# Patient Record
Sex: Male | Born: 1960 | Race: Black or African American | Hispanic: No | Marital: Married | Smoking: Never smoker
Health system: Southern US, Community
[De-identification: ages and names within clinical notes are randomized; demographics above are authoritative.]

## PROBLEM LIST (undated history)

## (undated) DIAGNOSIS — K219 Gastro-esophageal reflux disease without esophagitis: Secondary | ICD-10-CM

## (undated) DIAGNOSIS — D649 Anemia, unspecified: Secondary | ICD-10-CM

## (undated) DIAGNOSIS — R161 Splenomegaly, not elsewhere classified: Secondary | ICD-10-CM

## (undated) HISTORY — DX: Anemia, unspecified: D64.9

## (undated) HISTORY — DX: Splenomegaly, not elsewhere classified: R16.1

## (undated) HISTORY — DX: Gastro-esophageal reflux disease without esophagitis: K21.9

---

## 2013-02-16 ENCOUNTER — Ambulatory Visit (INDEPENDENT_AMBULATORY_CARE_PROVIDER_SITE_OTHER): Payer: No Typology Code available for payment source | Admitting: Family Medicine

## 2013-02-16 VITALS — BP 112/64 | HR 77 | Temp 98.5°F | Resp 18 | Ht 68.0 in | Wt 165.0 lb

## 2013-02-16 DIAGNOSIS — B1910 Unspecified viral hepatitis B without hepatic coma: Secondary | ICD-10-CM

## 2013-02-16 DIAGNOSIS — K2970 Gastritis, unspecified, without bleeding: Secondary | ICD-10-CM | POA: Insufficient documentation

## 2013-02-16 DIAGNOSIS — D696 Thrombocytopenia, unspecified: Secondary | ICD-10-CM

## 2013-02-16 DIAGNOSIS — R161 Splenomegaly, not elsewhere classified: Secondary | ICD-10-CM | POA: Insufficient documentation

## 2013-02-16 LAB — COMPREHENSIVE METABOLIC PANEL
Albumin: 3.5 g/dL (ref 3.5–5.2)
BUN: 19 mg/dL (ref 6–23)
CO2: 27 mEq/L (ref 19–32)
Calcium: 10.2 mg/dL (ref 8.4–10.5)
Chloride: 105 mEq/L (ref 96–112)
Creat: 1.63 mg/dL — ABNORMAL HIGH (ref 0.50–1.35)
Total Protein: 5.3 g/dL — ABNORMAL LOW (ref 6.0–8.3)

## 2013-02-16 LAB — POCT CBC
Granulocyte percent: 49.6 %G (ref 37–80)
HCT, POC: 31.7 % — AB (ref 43.5–53.7)
Lymph, poc: 1.3 (ref 0.6–3.4)
MCH, POC: 25.5 pg — AB (ref 27–31.2)
MCV: 84 fL (ref 80–97)
MID (cbc): 0.7 (ref 0–0.9)
Platelet Count, POC: 82 10*3/uL — AB (ref 142–424)
RBC: 3.77 M/uL — AB (ref 4.69–6.13)
WBC: 3.9 10*3/uL — AB (ref 4.6–10.2)

## 2013-02-16 MED ORDER — OMEPRAZOLE 40 MG PO CPDR: 40.0000 mg | DELAYED_RELEASE_CAPSULE | Freq: Every day | ORAL | Status: DC

## 2013-02-16 NOTE — Progress Notes (Addendum)
Subjective:    Patient ID: Anthony Arellano, male    DOB: 24-Aug-1960, 52 y.o.   MRN: [id number] Chief Complaint  Patient presents with  . wants to establish PCP    HPI HPI Comments: Anthony Arellano is a 52 y.o. male who presents to the [facility]  complaining of Enlarged spleen which began 7 months ago. Pt is trying to figure out what's causing the enlargement.  He has been seeing dr.'s in the [place] area - with Smitty Cords - but recent move to [place] and insurance changes have him relocating his care - told to come here and see Dr. Dareen Piano for new PCP by his insurance. Pt reports he has had abd CT scan and recent US.  A Also being evaluated for swollen left axillary lymph nodes. No biopsy does not have a hepatologist. Pt denies sickle cell anemia.  Pt has Hepatitis B for 24 years. 3 months ago Ultrasound liver was good at [facility] Last blood work was 3 months ago.  Pt is having epigastric abdominal pain. Denies pain after eating, but is using omeprazole which usually completely treats his pain but ran out of it yesterday. Currently weighs 160. Lost 10 lbs, over the past year and 20 lbs over the past 2 yrs.    No current outpatient prescriptions on file prior to visit.   No current facility-administered medications on file prior to visit.   Past Medical History  Diagnosis Date  . Splenomegaly   . GERD (gastroesophageal reflux disease)   . Anemia    No Known Allergies  Review of Systems  Constitutional: Negative for fever, chills, diaphoresis, activity change and appetite change.  Respiratory: Negative for shortness of breath.   Cardiovascular: Negative for chest pain.  Gastrointestinal: Positive for abdominal pain and abdominal distention. Negative for nausea, vomiting, diarrhea, constipation, blood in stool, anal bleeding and rectal pain.  Genitourinary: Negative for dysuria, frequency and decreased urine volume.  Hematological: Negative for adenopathy.      BP 112/64  Pulse 77   Temp(Src) 98.5 F (36.9 C) (Oral)  Resp 18  Ht 5\' 8"  (1.727 m)  Wt 165 lb (74.844 kg)  BMI 25.09 kg/m2 Objective:   Physical Exam  Nursing note and vitals reviewed. Constitutional: He is oriented to person, place, and time. He appears well-developed and well-nourished. No distress.  HENT:  Head: Normocephalic and atraumatic.  Eyes: EOM are normal.  Neck: Normal range of motion. Neck supple. No thyromegaly present.  No swollen lymph nodes.   Cardiovascular: Normal rate, regular rhythm, normal heart sounds and intact distal pulses.   Pulmonary/Chest: Effort normal and breath sounds normal. No respiratory distress.  Abdominal: Soft. Normal appearance and bowel sounds are normal. He exhibits no distension and no mass. There is splenomegaly. There is no tenderness. There is no rebound, no guarding, no CVA tenderness and negative Murphy's sign. No hernia.  Genitourinary: Rectum normal and prostate normal. Rectal exam shows no tenderness and anal tone normal. Guaiac negative stool.  Musculoskeletal: Normal range of motion.  Lymphadenopathy:    He has no cervical adenopathy.    He has axillary adenopathy.       Right axillary: Pectoral adenopathy present.       Left axillary: Pectoral adenopathy present.  L>Rt  Neurological: He is alert and oriented to person, place, and time.  Skin: Skin is warm and dry. He is not diaphoretic.  Psychiatric: He has a normal mood and affect. Judgment normal.      Results for orders placed  in visit on 02/16/13  POCT CBC      Result Value Range   WBC 3.9 (*) 4.6 - 10.2 K/uL   Lymph, poc 1.3  0.6 - 3.4   POC LYMPH PERCENT 33.6  10 - 50 %L   MID (cbc) 0.7  0 - 0.9   POC MID % 16.8 (*) 0 - 12 %M   POC Granulocyte 1.9 (*) 2 - 6.9   Granulocyte percent 49.6  37 - 80 %G   RBC 3.77 (*) 4.69 - 6.13 M/uL   Hemoglobin 9.6 (*) 14.1 - 18.1 g/dL   HCT, POC 16.1 (*) 09.6 - 53.7 %   MCV 84.0  80 - 97 fL   MCH, POC 25.5 (*) 27 - 31.2 pg   MCHC 30.3 (*) 31.8 - 35.4  g/dL   RDW, POC 04.5     Platelet Count, POC 82 (*) 142 - 424 K/uL   MPV 10.0  0 - 99.8 fL   Assessment & Plan:  Splenomegaly - Plan: POCT CBC, Comprehensive metabolic panel  Unspecified gastritis and gastroduodenitis without mention of hemorrhage  Hepatitis B - Plan: Comprehensive metabolic panel  Sign forms to receive previous testing and lab work. Blood work today. Advised pt to call clinic to see when Dr. Dareen Piano will be available or to schedule an appointment with me. Will refill omeprazole.  Referred to GI.  Meds ordered this encounter  Medications  . DISCONTD: omeprazole (PRILOSEC) 40 MG capsule    Sig: Take 40 mg by mouth daily.  Marland Kitchen omeprazole (PRILOSEC) 40 MG capsule    Sig: Take 1 capsule (40 mg total) by mouth daily.    Dispense:  90 capsule    Refill:  1

## 2013-02-18 ENCOUNTER — Encounter: Payer: Self-pay | Admitting: Family Medicine

## 2013-02-22 LAB — CBC AND DIFFERENTIAL
Platelets: 99 10*3/uL — AB (ref 150–399)
WBC: 4.3 10^3/mL

## 2013-02-22 LAB — HEPATIC FUNCTION PANEL: ALT: 96 U/L — AB (ref 10–40)

## 2013-02-22 LAB — BASIC METABOLIC PANEL: Creatinine: 1.8 mg/dL — AB (ref 0.6–1.3)

## 2013-02-27 ENCOUNTER — Telehealth: Payer: Self-pay

## 2013-02-27 NOTE — Telephone Encounter (Signed)
Language barrier. He is calling for referral, they need records from [place]. Patient indicates he does not have the records. He indicates he did call the previous provider in [place] and the records will be faxed to [facility]. Patient is asking if they were received here, have we gotten any records for patient here, he thinks they may have been sent here.

## 2013-02-27 NOTE — Telephone Encounter (Signed)
Dr Clelia Croft  Patient would like for you to call him regarding his I think he said biopsy? And also about his gi appointment

## 2013-02-27 NOTE — Telephone Encounter (Signed)
We had pt sign releases for his records while he was here but we have not received them yet. I will continue to keep an eye out for him.  If he wants to come back to clinic to re-sign releases and resend he can.

## 2013-02-28 NOTE — Telephone Encounter (Signed)
Re-faxed release today with confirmation.

## 2013-03-01 ENCOUNTER — Telehealth: Payer: Self-pay

## 2013-03-01 NOTE — Telephone Encounter (Signed)
[facility] Alver Fisher box currently. Patient says he will set up an appointment with Dr. Clelia Croft.

## 2013-03-01 NOTE — Telephone Encounter (Signed)
Still no medical records. Patient advised , VM

## 2013-03-01 NOTE — Telephone Encounter (Signed)
Patient is asking medical records has received any records from his previous dr please call patient with questions at [phone]

## 2013-03-02 ENCOUNTER — Ambulatory Visit (INDEPENDENT_AMBULATORY_CARE_PROVIDER_SITE_OTHER): Payer: No Typology Code available for payment source | Admitting: Family Medicine

## 2013-03-02 VITALS — BP 118/62 | HR 78 | Temp 98.2°F | Resp 18 | Ht 69.0 in | Wt 163.0 lb

## 2013-03-02 DIAGNOSIS — R161 Splenomegaly, not elsewhere classified: Secondary | ICD-10-CM

## 2013-03-02 DIAGNOSIS — B1910 Unspecified viral hepatitis B without hepatic coma: Secondary | ICD-10-CM

## 2013-03-02 DIAGNOSIS — R591 Generalized enlarged lymph nodes: Secondary | ICD-10-CM

## 2013-03-02 DIAGNOSIS — R599 Enlarged lymph nodes, unspecified: Secondary | ICD-10-CM

## 2013-03-02 DIAGNOSIS — D696 Thrombocytopenia, unspecified: Secondary | ICD-10-CM

## 2013-03-02 DIAGNOSIS — D649 Anemia, unspecified: Secondary | ICD-10-CM
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
## 2013-03-02 NOTE — Patient Instructions (Signed)
Results for orders placed in visit on 02/16/13  COMPREHENSIVE METABOLIC PANEL      Result Value Range   Sodium 139  135 - 145 mEq/L   Potassium 4.2  3.5 - 5.3 mEq/L   Chloride 105  96 - 112 mEq/L   CO2 27  19 - 32 mEq/L   Glucose, Bld 89  70 - 99 mg/dL   BUN 19  6 - 23 mg/dL   Creat 1.61 (*) 0.96 - 1.35 mg/dL   Total Bilirubin 0.8  0.3 - 1.2 mg/dL   Alkaline Phosphatase 173 (*) 39 - 117 U/L   AST 102 (*) 0 - 37 U/L   ALT 102 (*) 0 - 53 U/L   Total Protein 5.3 (*) 6.0 - 8.3 g/dL   Albumin 3.5  3.5 - 5.2 g/dL   Calcium 04.5  8.4 - 40.9 mg/dL  POCT CBC      Result Value Range   WBC 3.9 (*) 4.6 - 10.2 K/uL   Lymph, poc 1.3  0.6 - 3.4   POC LYMPH PERCENT 33.6  10 - 50 %L   MID (cbc) 0.7  0 - 0.9   POC MID % 16.8 (*) 0 - 12 %M   POC Granulocyte 1.9 (*) 2 - 6.9   Granulocyte percent 49.6  37 - 80 %G   RBC 3.77 (*) 4.69 - 6.13 M/uL   Hemoglobin 9.6 (*) 14.1 - 18.1 g/dL   HCT, POC 81.1 (*) 91.4 - 53.7 %   MCV 84.0  80 - 97 fL   MCH, POC 25.5 (*) 27 - 31.2 pg   MCHC 30.3 (*) 31.8 - 35.4 g/dL   RDW, POC 78.2     Platelet Count, POC 82 (*) 142 - 424 K/uL   MPV 10.0  0 - 99.8 fL

## 2013-03-04 ENCOUNTER — Telehealth: Payer: Self-pay

## 2013-03-04 NOTE — Telephone Encounter (Signed)
Patient calling to check status of referral information to an oncologist he was seen on the 18th by dr. Elbert Ewings. I have explained to the patient the referral process several different ways and he believes that it was supposed to be Monday that he is given an appointment. Please clarify to patient that we must wait for an appt date and time to schedule an appt with a specialist. He says he will keep calling our office unitl we give him the appt date and time.   Patient is very frustrated and there is a language barrier.   [phone]

## 2013-03-05 NOTE — Telephone Encounter (Signed)
Patient is aware of information. He has been waiting on the medical records from his office in [place] where he was seen previously, have these been received by [place]?Marland Kitchen There is a huge language barrier.

## 2013-03-05 NOTE — Telephone Encounter (Signed)
If Pt calls again:  Referring pt to [facility] Hematology/Oncology w/[facility].  Ph:  [phone] Fax: [phone] Received vmail for new pt coordinator. Message asks for 24-48 business hours to return call and provided fax number to fax referral. Left message and faxed all information.  Waiting on call back w/appt date time. Bf  If pt would like to contact the office we are referring him to, he is welcome to at the number provided above. All notes/referral faxed on 03/05/13.

## 2013-03-06 NOTE — Telephone Encounter (Signed)
The records are in the bottom of Dr Alver Fisher box. This has already been documented several days ago.

## 2013-03-06 NOTE — Telephone Encounter (Signed)
These need to be scanned in and sent to the office we referred him to. When you have reviewed, please forward to me.

## 2013-03-07 NOTE — Telephone Encounter (Signed)
Medical records reviewed and given to Amy for fax to specialist - pt needs to see gastroenterology, hematology, and vascular. Then please abstract highlighted portions and scan all. In summary, pt has had chronic hepatitis B since 1991. However, abd [place] on 09/09/12 shows liver is homogeneous w/o enlargement or focal deficit. However, marked splenomegaly has just been diagnosed in past year with the 09/09/12 [place] noted spleen of 26.5 cm craniocaudally.  Possibility of myelodysplastic syndrome or other primary bone marrow abnormality. Consider myelofibrosis. Cannot exclude sarcoidosis. Pt saw hematologist at [facility]  - Dr. Particia Jasper who noted that due to his thrombocytopenia and eosinophilia the differential diagnosis incluces a myeloproliferative disorder or chronic inflammatory process or infection (since he comes from the [place]). For eval he needs flow cytometry bone marrow aspiration biopsy and evaluation for inflammatory disorders with rheumatology as well. However, pt declined all of this due to lack of insurance so nothing has been done.  Pt's primary care notes state that he saw gastroenterology who does not think that his chronic hepatitis B has caused any of this. He has never had a routine colonoscopy.  Pt was apparently seen by [facility] but we do not have any of their notes. Also of note, pt's primary doctor did note a left carotic bruit on exam so we was referred to and seen by [facility] by Dr. Tyson Alias who recommended pt have carotid duplex exam and upper extremity US eval but again these were never done due to lack of insurance.

## 2013-03-07 NOTE — Telephone Encounter (Signed)
Thank you so much have given the records to Lupita Leash so this will be done in referrals.

## 2013-03-08 ENCOUNTER — Telehealth: Payer: Self-pay

## 2013-03-08 ENCOUNTER — Encounter: Payer: Self-pay | Admitting: Internal Medicine

## 2013-03-08 NOTE — Telephone Encounter (Signed)
Patient calling checking status of referral. States it has been 3 weeks and he still does not have an appt. Please send all related records so he can get an appt soon. Hard to understand patient.

## 2013-03-08 NOTE — Progress Notes (Signed)
Dr Juanda Chance has received extensive records from patient's previous GI physician in [place], [place]. Patient was being referred to [place] for hepatitis B. Per Darcey Nora, RN, we do not see patients for Hepatitis. Patient should go to [facility] liver clinic or to [facility] Liver clinic for evaluation. Advised PCC, Amy Hazelwood of this.

## 2013-03-21 ENCOUNTER — Telehealth: Payer: Self-pay

## 2013-03-21 ENCOUNTER — Other Ambulatory Visit: Payer: Self-pay

## 2013-03-21 DIAGNOSIS — R109 Unspecified abdominal pain: Secondary | ICD-10-CM

## 2013-03-21 NOTE — Telephone Encounter (Signed)
Spoke to [place] Arell[place] she does not have order for the [place] office, I have put in for her, so she can get records to them and get him taken care of.

## 2013-03-21 NOTE — Telephone Encounter (Signed)
Have you sent records to appropriate place, patient wants to know, can you call him?

## 2013-03-21 NOTE — Telephone Encounter (Signed)
Pt states that her records are here in the office and she is having to go to [place] or her records are going there, she wants to know who makes the decision about this? I could barley understand her, she was yelling into the phone Call back number is [phone]

## 2013-03-23 ENCOUNTER — Ambulatory Visit (INDEPENDENT_AMBULATORY_CARE_PROVIDER_SITE_OTHER): Payer: BC Managed Care – PPO | Admitting: Family Medicine

## 2013-03-23 VITALS — BP 110/60 | HR 70 | Temp 97.4°F | Resp 16 | Ht 68.5 in | Wt 163.0 lb

## 2013-03-23 DIAGNOSIS — K2970 Gastritis, unspecified, without bleeding: Secondary | ICD-10-CM

## 2013-03-23 DIAGNOSIS — R161 Splenomegaly, not elsewhere classified: Secondary | ICD-10-CM

## 2013-03-23 DIAGNOSIS — D696 Thrombocytopenia, unspecified: Secondary | ICD-10-CM

## 2013-03-23 DIAGNOSIS — N19 Unspecified kidney failure: Secondary | ICD-10-CM

## 2013-03-23 DIAGNOSIS — D61818 Other pancytopenia: Secondary | ICD-10-CM

## 2013-03-23 DIAGNOSIS — B1910 Unspecified viral hepatitis B without hepatic coma: Secondary | ICD-10-CM

## 2013-03-23 LAB — POCT CBC
Hemoglobin: 11.1 g/dL — AB (ref 14.1–18.1)
MCH, POC: 25.6 pg — AB (ref 27–31.2)
MCV: 86.7 fL (ref 80–97)
MID (cbc): 0.7 (ref 0–0.9)
POC Granulocyte: 1.6 — AB (ref 2–6.9)
POC LYMPH PERCENT: 40.6 %L (ref 10–50)
Platelet Count, POC: 70 10*3/uL — AB (ref 142–424)
RDW, POC: 18.7 %
WBC: 3.9 10*3/uL — AB (ref 4.6–10.2)

## 2013-03-23 LAB — COMPREHENSIVE METABOLIC PANEL
AST: 110 U/L — ABNORMAL HIGH (ref 0–37)
Albumin: 3.6 g/dL (ref 3.5–5.2)
Alkaline Phosphatase: 177 U/L — ABNORMAL HIGH (ref 39–117)
BUN: 23 mg/dL (ref 6–23)
Creat: 1.53 mg/dL — ABNORMAL HIGH (ref 0.50–1.35)
Glucose, Bld: 88 mg/dL (ref 70–99)
Potassium: 4.4 mEq/L (ref 3.5–5.3)
Total Bilirubin: 0.5 mg/dL (ref 0.3–1.2)

## 2013-03-23 LAB — POCT URINALYSIS DIPSTICK
Blood, UA: NEGATIVE
Protein, UA: 100
Spec Grav, UA: >=1.03
Urobilinogen, UA: 1

## 2013-03-23 LAB — POCT SEDIMENTATION RATE: POCT SED RATE: 4 mm/hr (ref 0–22)

## 2013-03-23 MED ORDER — OMEPRAZOLE 40 MG PO CPDR: 40.0000 mg | DELAYED_RELEASE_CAPSULE | Freq: Every day | ORAL | Status: DC

## 2013-03-23 NOTE — Progress Notes (Addendum)
This chart was scribed for Norberto Sorenson, MD by Ardelia Mems, Scribe. This patient was seen in room 12 and the patient's care was started at 11:22 AM.  Subjective:    Patient ID: Anthony Arellano, male    DOB: 1961-04-10, 52 y.o.   MRN: [id number]  Chief Complaint  Patient presents with  . Follow-up   HPI  HPI Comments: Anthony Arellano is a 52 y.o. male with a history of Hepatitis B, and a  6 month history of splenomegaly who presents to [facility] for a follow-up evaluation of his splenomegaly which was last evaluated here 03/02/13. The cause of his his spleen enlargement is yet unknown. He states that he has an appointment with an Best boy at [facility] Hematology/Oncology w/[facility] on 04/01/13.  [facility] declined to see him for his epigastric pain due to his h/o Hep B and they referred him to [facility] liver clinic for further evaluation.   He states that he has had some fluctuating abnormal labs recently, and is wanting to see if there are any medications that could help with this. He is appropriately concerned about his dropping blood counts and worsening thrombocytopenia as well as elevating liver and kidneys function tests.  He is very agreeable to having additional blood work obtained today.   Requests a refill on his omeprazole for his epigastric pain.  Pt is originally from [place], and has been here in the [place] for 12 years. He lives in [place] and was prev working as a Naval architect. He states he periodically has had occasional abdominal pain, but he states that he has no symptoms or pain currently. He has been doing on-line research to learn about symptoms to watch out for, and he has had none.  In summary, pt has had chronic hepatitis B since 1991. However, abd [place] on 09/09/12 shows liver is homogeneous w/o enlargement or focal deficit. However, marked splenomegaly has just been diagnosed in past year with the 09/09/12 [place] noted  spleen of 26.5 cm craniocaudally. Possibility of myelodysplastic syndrome or other primary bone marrow abnormality. Consider myelofibrosis. Cannot exclude sarcoidosis. Pt saw hematologist at [facility] - Dr. Particia Jasper who noted that due to his thrombocytopenia and eosinophilia the differential diagnosis incluces a myeloproliferative disorder or chronic inflammatory process or infection (since he comes from the [place]). For eval he needs flow cytometry bone marrow aspiration biopsy and evaluation for inflammatory disorders with rheumatology as well. However, pt declined all of this due to lack of insurance so nothing has been done. Pt's primary care notes state that he saw gastroenterology who does not think that his chronic hepatitis B has caused any of this. He has never had a routine colonoscopy. Pt was apparently seen by [facility] but we do not have any of their notes.  Also of note, pt's primary doctor did note a left carotid bruit on exam so we was referred to and seen by [facility] by Dr. Tyson Alias who recommended pt have carotid duplex exam and upper extremity US eval but again these were never done due to lack of insurance.   Past Medical History  Diagnosis Date  . Splenomegaly   . GERD (gastroesophageal reflux disease)   . Anemia    No current outpatient prescriptions on file prior to visit.   No current facility-administered medications on file prior to visit.   No Known Allergies  Review of Systems  Constitutional: Negative  for fever and chills.  HENT: Negative for sore throat.   Respiratory: Negative for cough and shortness of breath.   Cardiovascular: Negative for chest pain.  Gastrointestinal: Positive for abdominal pain (occasionally) and abdominal distention (splenomegaly). Negative for nausea, vomiting, diarrhea, constipation, blood in stool and anal bleeding.  Genitourinary: Negative for dysuria,  urgency, hematuria, flank pain, decreased urine volume and difficulty urinating.  Skin: Negative for color change, pallor, rash and wound.  Hematological: Does not bruise/bleed easily.      BP 110/60  Pulse 70  Temp(Src) 97.4 F (36.3 C) (Oral)  Resp 16  Ht 5' 8.5" (1.74 m)  Wt 163 lb (73.936 kg)  BMI 24.42 kg/m2  SpO2 100% Objective:   Physical Exam  Nursing note and vitals reviewed. Constitutional: He is oriented to person, place, and time. He appears well-developed and well-nourished. No distress.  HENT:  Head: Normocephalic and atraumatic.  Eyes: EOM are normal.  Neck: Neck supple. No tracheal deviation present.  Cardiovascular: Normal rate.   Pulmonary/Chest: Effort normal. No respiratory distress.  Abdominal: Soft. He exhibits mass. He exhibits no distension. There is splenomegaly. There is no tenderness. There is no rigidity, no rebound, no guarding and no CVA tenderness.  Musculoskeletal: Normal range of motion.  Neurological: He is alert and oriented to person, place, and time.  Skin: Skin is warm and dry.  Psychiatric: He has a normal mood and affect. His behavior is normal.   Results for orders placed in visit on 03/23/13  POCT CBC      Result Value Range   WBC 3.9 (*) 4.6 - 10.2 K/uL   Lymph, poc 1.6  0.6 - 3.4   POC LYMPH PERCENT 40.6  10 - 50 %L   MID (cbc) 0.7  0 - 0.9   POC MID % 18.0 (*) 0 - 12 %M   POC Granulocyte 1.6 (*) 2 - 6.9   Granulocyte percent 41.4  37 - 80 %G   RBC 4.34 (*) 4.69 - 6.13 M/uL   Hemoglobin 11.1 (*) 14.1 - 18.1 g/dL   HCT, POC 16.1 (*) 09.6 - 53.7 %   MCV 86.7  80 - 97 fL   MCH, POC 25.6 (*) 27 - 31.2 pg   MCHC 29.5 (*) 31.8 - 35.4 g/dL   RDW, POC 04.5     Platelet Count, POC 70 (*) 142 - 424 K/uL   MPV 13.6  0 - 99.8 fL  POCT URINALYSIS DIPSTICK      Result Value Range   Color, UA yellow     Clarity, UA clear     Glucose, UA 250     Bilirubin, UA neg     Ketones, UA neg     Spec Grav, UA >=1.030     Blood, UA neg      pH, UA 5.5     Protein, UA 100     Urobilinogen, UA 1.0     Nitrite, UA neg     Leukocytes, UA Negative    POCT SEDIMENTATION RATE      Result Value Range   POCT SED RATE 4  0 - 22 mm/hr       Assessment & Plan:  Unspecified gastritis and gastroduodenitis without mention of hemorrhage - Plan: POCT CBC, POCT urinalysis dipstick, Comprehensive metabolic panel, POCT SEDIMENTATION RATE, C-reactive protein, ANA - refilled ppi.  Splenomegaly - Plan: POCT CBC, POCT urinalysis dipstick, Comprehensive metabolic panel, POCT SEDIMENTATION RATE, C-reactive protein, ANA - Pt has appt w/  hematology/oncology in about a wk - he is hoping they will arrange for a lymph node biopsy and a bone marrow biopsy at that time  Hepatitis B - Plan: POCT CBC, POCT urinalysis dipstick, Comprehensive metabolic panel, POCT SEDIMENTATION RATE, C-reactive protein, ANA - Pt has been referred to [facility] liver center - lfts elevated but prior hepatologist did not think hepatitis B had much to do with his current larger problems of splenomegaly and pancytopenia  Renal failure - Plan: POCT CBC, POCT urinalysis dipstick, Comprehensive metabolic panel, POCT SEDIMENTATION RATE, C-reactive protein, ANA - recent in onset - unknown etiology - attributed to whatever underlying condition he has, recheck today.  Thrombocytopenia, unspecified - worsening - I am concerned that this is his most pressing medical issue and may make needed biopsies difficult.  Will have labs faxed to [facility] Heme/Onc  Meds ordered this encounter  Medications  . omeprazole (PRILOSEC) 40 MG capsule    Sig: Take 1 capsule (40 mg total) by mouth daily.    Dispense:  90 capsule    Refill:  1    I personally performed the services described in this documentation, which was scribed in my presence. The recorded information has been reviewed and considered, and addended by me as needed.  Norberto Sorenson, MD MPH

## 2013-03-23 NOTE — Patient Instructions (Signed)
[facility] Hematology/Oncology w/[facility].  Appointment for Nov 17. Ph: [phone]  Fax: [phone]  Looks like you were also referred to [facility] liver center for evaluation of your hepatitis

## 2013-03-25 DIAGNOSIS — D61818 Other pancytopenia: Secondary | ICD-10-CM | POA: Insufficient documentation

## 2013-03-26 ENCOUNTER — Telehealth: Payer: Self-pay

## 2013-03-26 NOTE — Telephone Encounter (Signed)
Language Barrier*  Pt. Called wanting to know when his appointment is at [facility] liver center. Pt says that we were supposed to call him Monday 11/10 with his appointment time and we never called him back. We had to call an interpreter to help [place] to understand the patient's requests.

## 2013-04-22 ENCOUNTER — Encounter: Payer: Self-pay | Admitting: *Deleted

## 2013-04-22 DIAGNOSIS — D696 Thrombocytopenia, unspecified: Secondary | ICD-10-CM

## 2013-04-22 DIAGNOSIS — R161 Splenomegaly, not elsewhere classified: Secondary | ICD-10-CM

## 2013-06-07 ENCOUNTER — Encounter: Payer: Self-pay | Admitting: *Deleted

## 2013-06-07 DIAGNOSIS — B1910 Unspecified viral hepatitis B without hepatic coma: Secondary | ICD-10-CM

## 2013-06-07 DIAGNOSIS — C8590 Non-Hodgkin lymphoma, unspecified, unspecified site: Secondary | ICD-10-CM

## 2013-07-05 ENCOUNTER — Inpatient Hospital Stay
Admission: RE | Admit: 2013-07-05 | Discharge: 2013-07-05 | Disposition: A | Discharge status: Home / Self Care | Payer: Self-pay | Source: Ambulatory Visit | Attending: Family Medicine | Admitting: Family Medicine

## 2013-07-05 ENCOUNTER — Ambulatory Visit (INDEPENDENT_AMBULATORY_CARE_PROVIDER_SITE_OTHER): Payer: BC Managed Care – PPO | Admitting: Family Medicine

## 2013-07-05 ENCOUNTER — Ambulatory Visit: Payer: BC Managed Care – PPO

## 2013-07-05 VITALS — BP 124/58 | HR 85 | Temp 98.7°F | Resp 16 | Ht 68.0 in | Wt 159.6 lb

## 2013-07-05 DIAGNOSIS — R5383 Other fatigue: Secondary | ICD-10-CM

## 2013-07-05 DIAGNOSIS — R059 Cough, unspecified: Secondary | ICD-10-CM

## 2013-07-05 DIAGNOSIS — R5381 Other malaise: Secondary | ICD-10-CM

## 2013-07-05 DIAGNOSIS — J189 Pneumonia, unspecified organism: Secondary | ICD-10-CM

## 2013-07-05 DIAGNOSIS — C8589 Other specified types of non-Hodgkin lymphoma, extranodal and solid organ sites: Secondary | ICD-10-CM

## 2013-07-05 DIAGNOSIS — C8590 Non-Hodgkin lymphoma, unspecified, unspecified site: Secondary | ICD-10-CM

## 2013-07-05 DIAGNOSIS — R05 Cough: Secondary | ICD-10-CM

## 2013-07-05 LAB — POCT CBC
Granulocyte percent: 72.5 %G (ref 37–80)
HCT, POC: 38.4 % — AB (ref 43.5–53.7)
HEMOGLOBIN: 11.8 g/dL — AB (ref 14.1–18.1)
Lymph, poc: 1.5 (ref 0.6–3.4)
MCH: 28.6 pg (ref 27–31.2)
MCHC: 30.7 g/dL — AB (ref 31.8–35.4)
MCV: 93.3 fL (ref 80–97)
MID (CBC): 0.6 (ref 0–0.9)
MPV: 11.3 fL (ref 0–99.8)
PLATELET COUNT, POC: 124 10*3/uL — AB (ref 142–424)
POC Granulocyte: 5.7 (ref 2–6.9)
POC LYMPH %: 19.3 % (ref 10–50)
POC MID %: 8.2 % (ref 0–12)
RBC: 4.12 M/uL — AB (ref 4.69–6.13)
RDW, POC: 15.1 %
WBC: 7.9 10*3/uL (ref 4.6–10.2)

## 2013-07-05 LAB — POCT INFLUENZA A/B
Influenza A, POC: NEGATIVE
Influenza B, POC: NEGATIVE

## 2013-07-05 MED ORDER — CEFDINIR 300 MG PO CAPS: 300.0000 mg | ORAL_CAPSULE | Freq: Two times a day (BID) | ORAL | Status: DC

## 2013-07-05 NOTE — Patient Instructions (Signed)
I think you have a mild pneumonia. Use the omnicef antibiotic as directed.  Buy a thermometer OTC and use it to check your temperature.  Let me know if you are running a fever over 100 degrees, or if you do not feel better in the next few days- Sooner if worse.

## 2013-07-05 NOTE — Progress Notes (Addendum)
[facility] [address] [place] [address] [phone]  Date:  07/05/2013   Name:  Anthony Arellano   DOB:  08-27-1960   MRN:  [id number]  PCP:  No PCP Per Patient    Chief Complaint: Fatigue, Fever, Otalgia and Cough   History of Present Illness:  Anthony Arellano is a 53 y.o. very pleasant male patient who presents with the following:  He is here today with illness for about 2.5 weeks.  He has noted a subjective fever daily, sometimes a headache, feeling cold and hot.   He has not actually taken his temperature.   He does have a cough.   He does feel that his sx are getting worse.   He notes decreased hearing in his left ear for the same period of time.  Otherwise no earache, also a ST.    No diarrhea.   He does cough a lot at night which makes his stomach hurt  He is being treated for lymphoma at [facility]- dr Vira Agar is his main provider.  He was dx with a lymph node bx/ BM bx just a few months ago in November.  He is supposed to start some sort of tx today "to protect my liver due to my hepatitis B" and then in another month he will start chemo.  He is not yet on any chemotherapy and states he does not need radiation or surgery   He states he is not aware of any problem with his kidneys.   Patient Active Problem List   Diagnosis Date Noted  . Lymphoma 06/07/2013  . Pancytopenia, acquired 03/25/2013  . Renal failure 03/23/2013  . Thrombocytopenia, unspecified 03/23/2013  . Hepatitis B 02/16/2013  . Unspecified gastritis and gastroduodenitis without mention of hemorrhage 02/16/2013  . Splenomegaly 02/16/2013    Past Medical History  Diagnosis Date  . Splenomegaly   . GERD (gastroesophageal reflux disease)   . Anemia     History reviewed. No pertinent past surgical history.  History  Substance Use Topics  . Smoking status: Never Smoker   . Smokeless tobacco: Not on file  . Alcohol Use: No    Family History  Problem  Relation Age of Onset  . Diabetes Mother   . Hypertension Mother     No Known Allergies  Medication list has been reviewed and updated.  Current Outpatient Prescriptions on File Prior to Visit  Medication Sig Dispense Refill  . omeprazole (PRILOSEC) 40 MG capsule Take 1 capsule (40 mg total) by mouth daily.  90 capsule  1   No current facility-administered medications on file prior to visit.    Review of Systems:  As per HPI- otherwise negative.   Physical Examination: Filed Vitals:   07/05/13 1146  BP: 124/58  Pulse: 85  Temp: 98.7 F (37.1 C)  Resp: 16   Filed Vitals:   07/05/13 1146  Height: 5\' 8"  (1.727 m)  Weight: 159 lb 9.6 oz (72.394 kg)   Body mass index is 24.27 kg/(m^2). Ideal Body Weight: Weight in (lb) to have BMI = 25: 164.1  GEN: WDWN, NAD, Non-toxic, A & O x 3, looks well, runny nose HEENT: Atraumatic, Normocephalic. Neck supple. No masses, No LAD.  Bilateral TM wnl, oropharynx normal.  PEERL,EOMI.  Ears and Nose: No external deformity. CV: RRR, No M/G/R. No JVD. No thrill. No extra heart sounds. PULM: CTA B, no wheezes, crackles, rhonchi. No retractions. No resp. distress. No accessory muscle use. ABD:  S, NT, ND, +BS. No rebound. Non- tender splenomegaly  EXTR: No c/c/e NEURO Normal gait.  PSYCH: Normally interactive. Conversant. Not depressed or anxious appearing.  Calm demeanor.    Results for orders placed in visit on 07/05/13  POCT CBC      Result Value Ref Range   WBC 7.9  4.6 - 10.2 K/uL   Lymph, poc 1.5  0.6 - 3.4   POC LYMPH PERCENT 19.3  10 - 50 %L   MID (cbc) 0.6  0 - 0.9   POC MID % 8.2  0 - 12 %M   POC Granulocyte 5.7  2 - 6.9   Granulocyte percent 72.5  37 - 80 %G   RBC 4.12 (*) 4.69 - 6.13 M/uL   Hemoglobin 11.8 (*) 14.1 - 18.1 g/dL   HCT, POC 38.4 (*) 43.5 - 53.7 %   MCV 93.3  80 - 97 fL   MCH, POC 28.6  27 - 31.2 pg   MCHC 30.7 (*) 31.8 - 35.4 g/dL   RDW, POC 15.1     Platelet Count, POC 124 (*) 142 - 424 K/uL   MPV  11.3  0 - 99.8 fL  POCT INFLUENZA A/B      Result Value Ref Range   Influenza A, POC Negative     Influenza B, POC Negative     [facility] reading (PRIMARY) by  Dr. Lorelei Pont. CXR: patchy RLL infiltrate  CHEST 2 VIEW  COMPARISON: None.  FINDINGS: The heart size and mediastinal contours are within normal limits. No pneumothorax or pleural effusion is noted. Ill-defined opacity is noted medially in the right middle lobe which may represent pneumonia or atelectasis. Left lung is clear. Nodular density is seen in the right midlung concerning for focal inflammation or possible nodule. The visualized skeletal structures are unremarkable.  IMPRESSION: Ill-defined opacity is noted medially in the right middle lobe which may represent pneumonia or subsegmental atelectasis. Also noted is nodular density in right midlung which may represent focal inflammation, but short-term follow-up radiographs are recommended to ensure resolution and rule out the possibility of neoplasm.   Called and discussed with oncologist on call at [facility]- ok to treat for mild pneumonia like we generally would  creatinine clearance is in the 50's- ok for normal dosing regimen  Assessment and Plan: Walking pneumonia - Plan: cefdinir (OMNICEF) 300 MG capsule  Cough - Plan: DG Chest 2 View, DG Chest 2 View, DG Chest 2 View  [place] - Plan: POCT CBC, POCT Influenza A/B, DG Chest 2 View, DG Chest 2 View  Lymphoma  53 year old man with history of hepatitis B and lymphoma, here today with mild pneumonia.  Will treat with omnicef, close follow-up if not better soon- Sooner if worse.   See patient instructions for more details.     Signed Lamar Blinks, MD

## 2013-07-08 ENCOUNTER — Telehealth: Payer: Self-pay | Admitting: *Deleted

## 2013-07-08 ENCOUNTER — Encounter: Payer: Self-pay | Admitting: Family Medicine

## 2013-07-08 NOTE — Telephone Encounter (Signed)
Faxed letter and xray report to Dr Grier Mitts [facility]), per Dr Lorelei Pont. Confirmation page received at 11:01 am.

## 2013-07-11 NOTE — Progress Notes (Unsigned)
Received a call from Dr. Vira Agar today; she will see Anthony Arellano in a month and will do a CT scan for follow-up

## 2013-07-27 ENCOUNTER — Ambulatory Visit (INDEPENDENT_AMBULATORY_CARE_PROVIDER_SITE_OTHER): Payer: BC Managed Care – PPO | Admitting: Emergency Medicine

## 2013-07-27 ENCOUNTER — Ambulatory Visit: Payer: BC Managed Care – PPO

## 2013-07-27 VITALS — BP 128/68 | HR 70 | Temp 98.2°F | Resp 16 | Ht 67.5 in | Wt 161.2 lb

## 2013-07-27 DIAGNOSIS — J189 Pneumonia, unspecified organism: Secondary | ICD-10-CM

## 2013-07-27 LAB — POCT CBC
GRANULOCYTE PERCENT: 46.5 % (ref 37–80)
HEMATOCRIT: 42.2 % — AB (ref 43.5–53.7)
HEMOGLOBIN: 13.2 g/dL — AB (ref 14.1–18.1)
Lymph, poc: 1.9 (ref 0.6–3.4)
MCH, POC: 29.5 pg (ref 27–31.2)
MCHC: 31.3 g/dL — AB (ref 31.8–35.4)
MCV: 94.3 fL (ref 80–97)
MID (cbc): 0.9 (ref 0–0.9)
POC GRANULOCYTE: 2.4 (ref 2–6.9)
POC LYMPH PERCENT: 36.4 %L (ref 10–50)
POC MID %: 17.1 % — AB (ref 0–12)
Platelet Count, POC: 76 10*3/uL — AB (ref 142–424)
RBC: 4.48 M/uL — AB (ref 4.69–6.13)
RDW, POC: 16.3 %
WBC: 5.2 10*3/uL (ref 4.6–10.2)

## 2013-07-27 NOTE — Patient Instructions (Signed)

## 2013-07-27 NOTE — Progress Notes (Signed)
[facility] [address] [place] [address] [phone]  Date:  07/27/2013   Name:  Anthony Arellano   DOB:  [birth year]   MRN:  [id number]  PCP:  No PCP Per Patient    Chief Complaint: Cough   History of Present Illness:  Anthony Arellano is a 53 y.o. very pleasant male patient who presents with the following:  Treated two weeks ago and says when he finished the omnicef he felt "better" but now is feeling "bad" again.  Has not started his chemo for lymphoma as yet.  Has no sore throat, coryza, fever, chills, nausea or vomiting.  No stool change.  No wheezing or shortness of breath. Has an occasional non productive cough.  No improvement with over the counter medications or other home remedies. Denies other complaint or health concern today.   Patient Active Problem List   Diagnosis Date Noted  . Lymphoma 06/07/2013  . Pancytopenia, acquired 03/25/2013  . Renal failure 03/23/2013  . Thrombocytopenia, unspecified 03/23/2013  . Hepatitis B 02/16/2013  . Unspecified gastritis and gastroduodenitis without mention of hemorrhage 02/16/2013  . Splenomegaly 02/16/2013    Past Medical History  Diagnosis Date  . Splenomegaly   . GERD (gastroesophageal reflux disease)   . Anemia     No past surgical history on file.  History  Substance Use Topics  . Smoking status: Never Smoker   . Smokeless tobacco: Not on file  . Alcohol Use: No    Family History  Problem Relation Age of Onset  . Diabetes Mother   . Hypertension Mother     No Known Allergies  Medication list has been reviewed and updated.  No current outpatient prescriptions on file prior to visit.   No current facility-administered medications on file prior to visit.    Review of Systems:  As per HPI, otherwise negative.    Physical Examination: Filed Vitals:   07/27/13 1545  BP: 128/68  Pulse: 70  Temp: 98.2 F (36.8 C)  Resp: 16   Filed Vitals:   07/27/13 1545   Height: 5' 7.5" (1.715 m)  Weight: 161 lb 4 oz (73.143 kg)   Body mass index is 24.87 kg/(m^2). Ideal Body Weight: Weight in (lb) to have BMI = 25: 161.7  GEN: WDWN, NAD, Non-toxic, A & O x 3 HEENT: Atraumatic, Normocephalic. Neck supple. No masses, No LAD. Ears and Nose: No external deformity. CV: RRR, No M/G/R. No JVD. No thrill. No extra heart sounds. PULM: CTA B, no wheezes, crackles, rhonchi. No retractions. No resp. distress. No accessory muscle use. ABD: S, NT, ND, +BS. No rebound. No HSM. EXTR: No c/c/e NEURO Normal gait.  PSYCH: Normally interactive. Conversant. Not depressed or anxious appearing.  Calm demeanor.    Assessment and Plan: Lymphoma pending chemo with thrombocytopenia  Signed,  Ellison Carwin, MD   [facility] reading (PRIMARY) by  Dr. Ouida Sills.  Chest:  Don't see prior area of concern on today's film but has linear atelectasis in right base.Marland Kitchen  Results for orders placed in visit on 07/27/13  POCT CBC      Result Value Ref Range   WBC 5.2  4.6 - 10.2 K/uL   Lymph, poc 1.9  0.6 - 3.4   POC LYMPH PERCENT 36.4  10 - 50 %L   MID (cbc) 0.9  0 - 0.9   POC MID % 17.1 (*) 0 - 12 %M   POC Granulocyte 2.4  2 - 6.9  Granulocyte percent 46.5  37 - 80 %G   RBC 4.48 (*) 4.69 - 6.13 M/uL   Hemoglobin 13.2 (*) 14.1 - 18.1 g/dL   HCT, POC 42.2 (*) 43.5 - 53.7 %   MCV 94.3  80 - 97 fL   MCH, POC 29.5  27 - 31.2 pg   MCHC 31.3 (*) 31.8 - 35.4 g/dL   RDW, POC 16.3     Platelet Count, POC 76 (*) 142 - 424 K/uL   MPV    0 - 99.8 fL

## 2013-11-05 ENCOUNTER — Encounter: Payer: Self-pay | Admitting: Family Medicine

## 2014-08-15 DIAGNOSIS — B181 Chronic viral hepatitis B without delta-agent: Secondary | ICD-10-CM | POA: Insufficient documentation

## 2014-08-15 DIAGNOSIS — C8300 Small cell B-cell lymphoma, unspecified site: Secondary | ICD-10-CM | POA: Insufficient documentation

## 2014-09-30 ENCOUNTER — Encounter: Payer: Self-pay | Admitting: *Deleted

## 2014-09-30 DIAGNOSIS — B181 Chronic viral hepatitis B without delta-agent: Secondary | ICD-10-CM

## 2014-09-30 DIAGNOSIS — C8300 Small cell B-cell lymphoma, unspecified site: Secondary | ICD-10-CM

## 2015-06-02 IMAGING — CR DG CHEST 2V
2 series · 2 of 2 positions shown · non-contrast
Comparison: DG CHEST 2V dated 07/05/2013

CLINICAL DATA: Followup of pneumonia.

EXAM:
CHEST  2 VIEW

[PA]
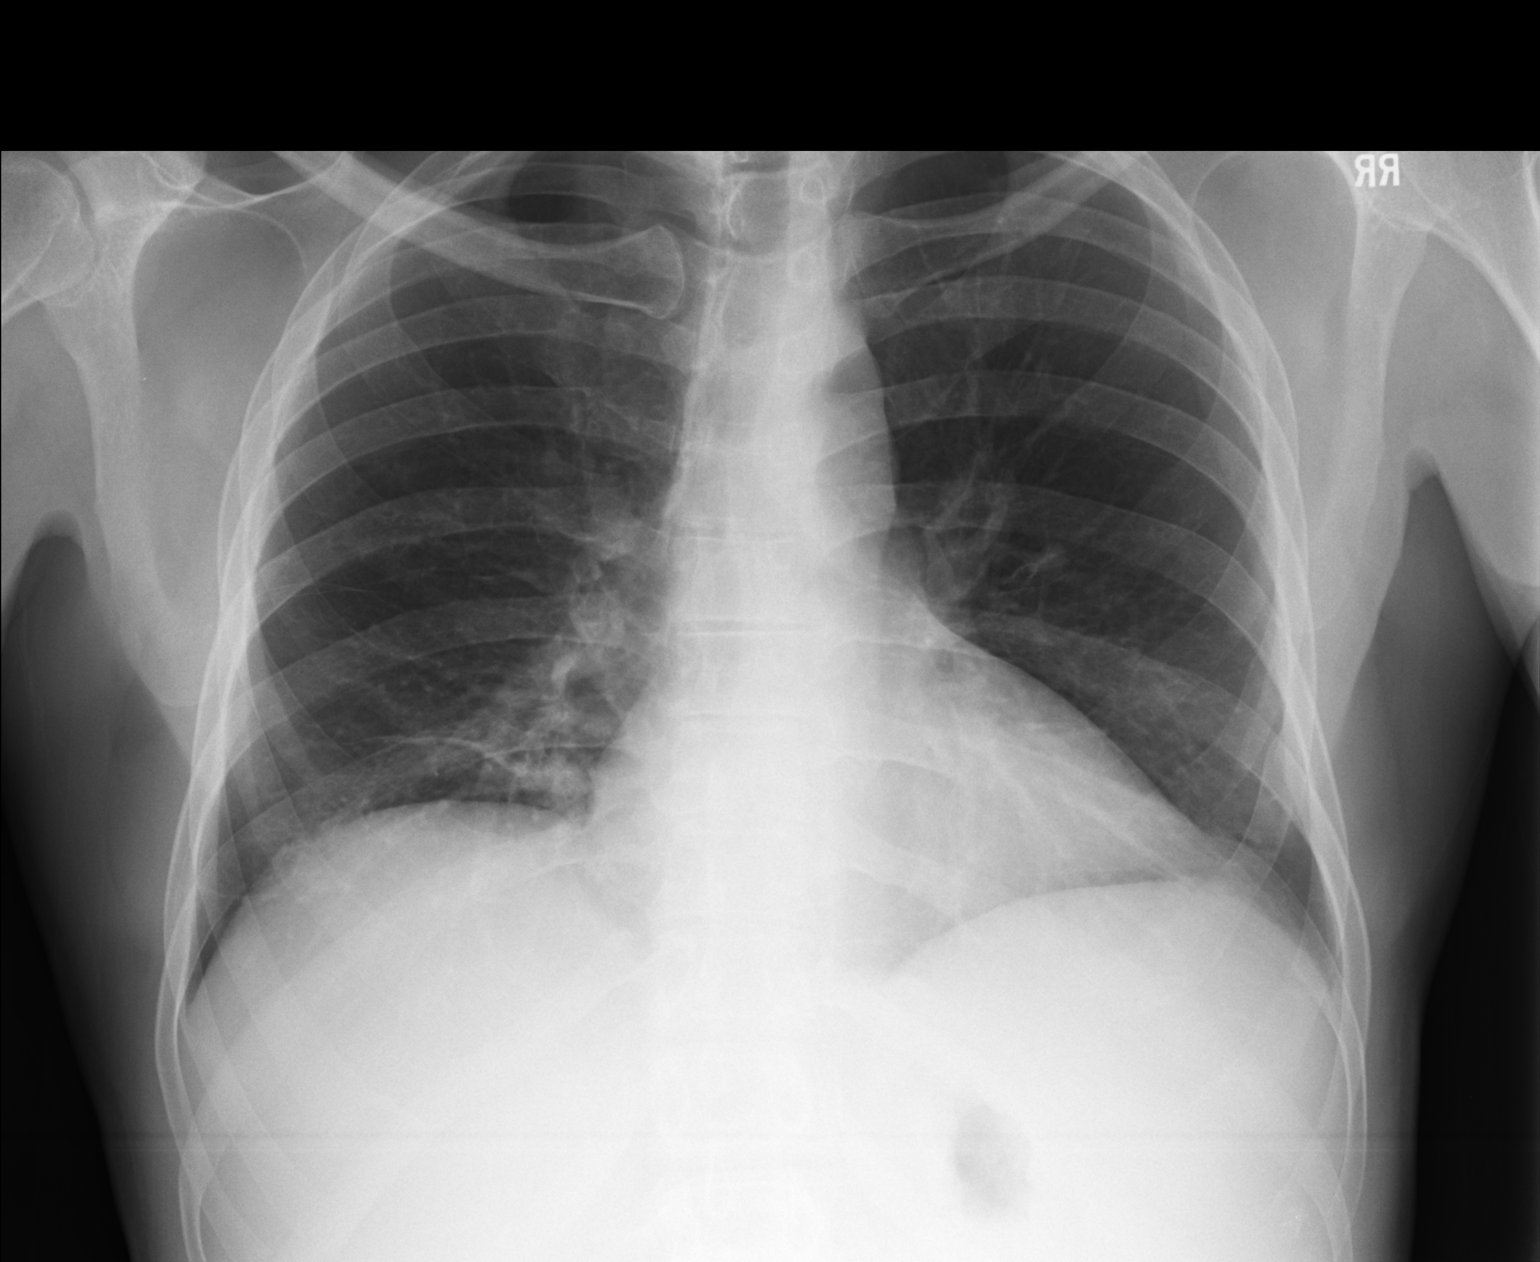

[lateral]
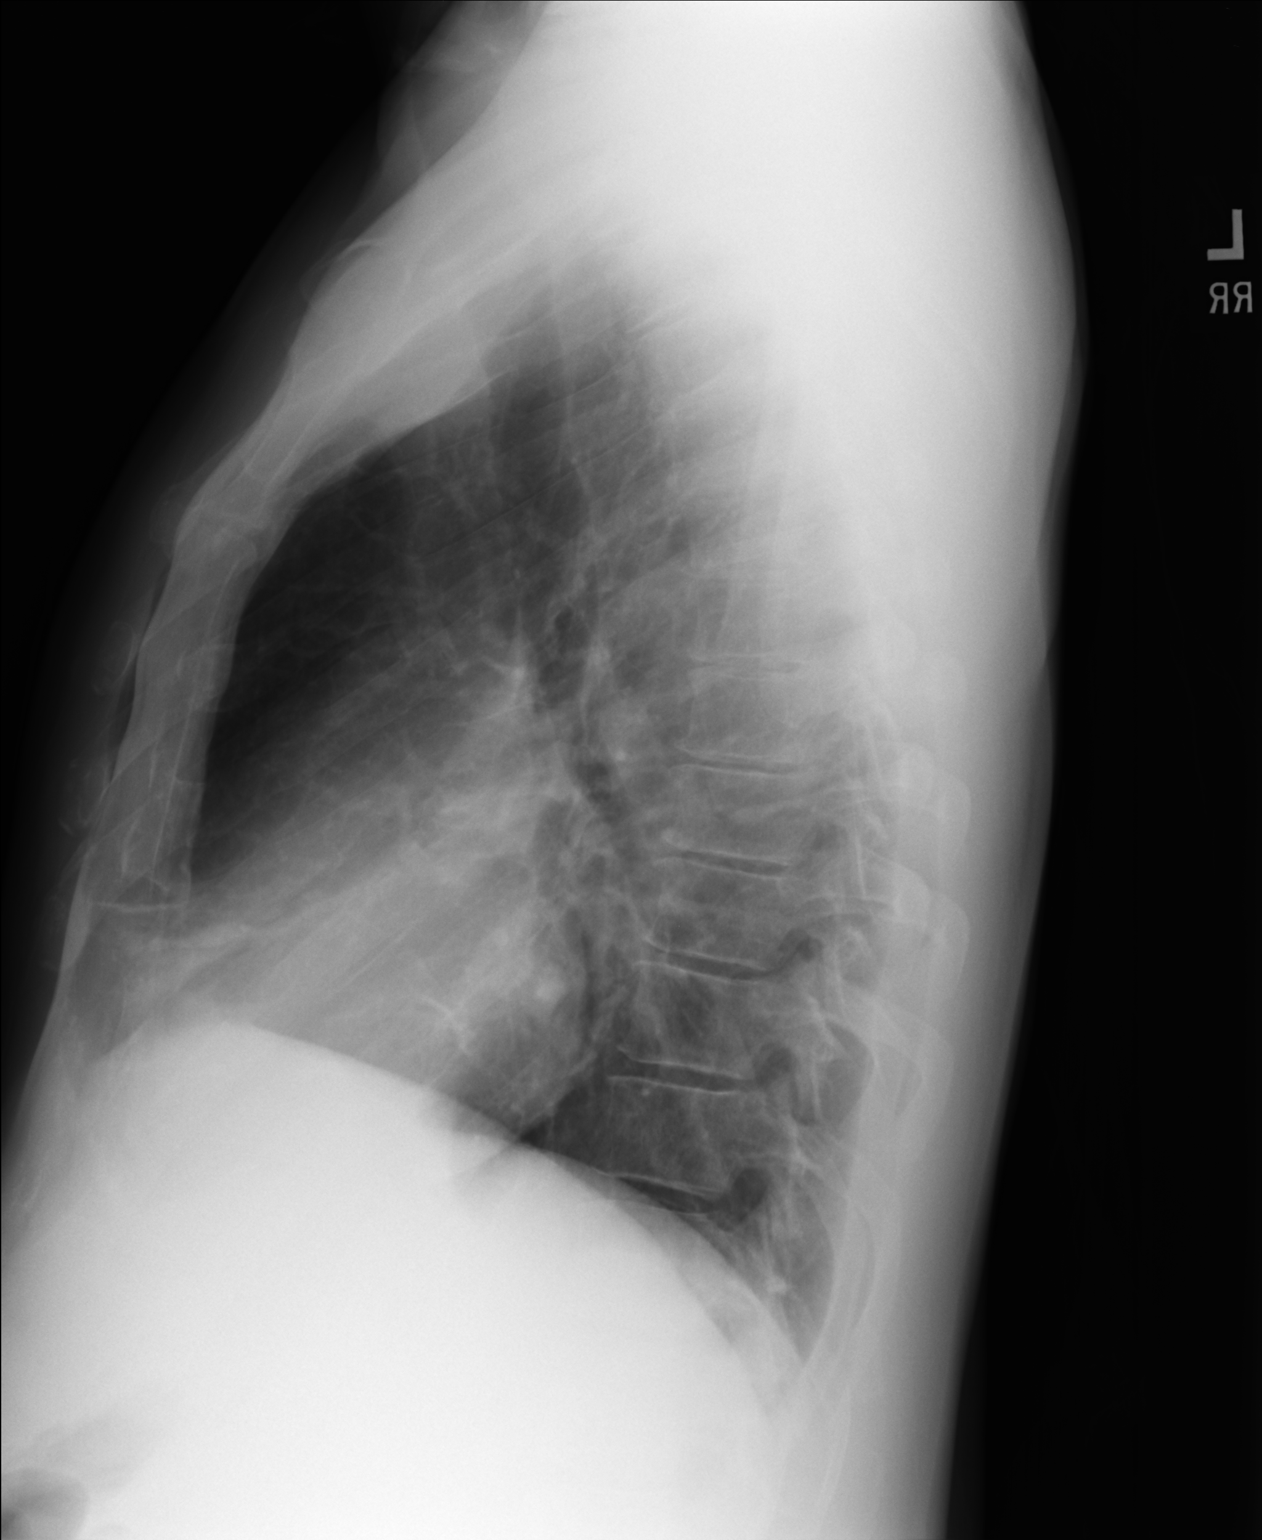

[2 of 2 positions shown; findings below may reference images not displayed]

FINDINGS: Midline trachea. Heart size upper normal. Mediastinal contours
otherwise within normal limits. No pleural effusion or pneumothorax.
Resolved right middle lobe pneumonia. There may be minimal volume
loss in this area remaining anteriorly on the lateral view.
Similarly, the questioned right upper lobe opacity has resolved.
Right lower lobe subsegmental atelectasis is also identified.
IMPRESSION: No evidence residual pneumonia. Right middle and right lower lobe
atelectasis remain.
